# Patient Record
Sex: Female | Born: 1980 | Race: White | Hispanic: No | Marital: Married | Smoking: Never smoker
Health system: Southern US, Community
[De-identification: ages and names within clinical notes are randomized; demographics above are authoritative.]

## PROBLEM LIST (undated history)

## (undated) DIAGNOSIS — R112 Nausea with vomiting, unspecified: Secondary | ICD-10-CM

## (undated) DIAGNOSIS — D649 Anemia, unspecified: Secondary | ICD-10-CM

## (undated) DIAGNOSIS — D259 Leiomyoma of uterus, unspecified: Secondary | ICD-10-CM

## (undated) DIAGNOSIS — Z9889 Other specified postprocedural states: Secondary | ICD-10-CM

## (undated) DIAGNOSIS — N920 Excessive and frequent menstruation with regular cycle: Secondary | ICD-10-CM

---

## 2000-08-18 ENCOUNTER — Inpatient Hospital Stay (HOSPITAL_COMMUNITY): Admission: AD | Admit: 2000-08-18 | Discharge: 2000-08-20 | Payer: Self-pay | Admitting: Obstetrics and Gynecology

## 2000-08-18 ENCOUNTER — Other Ambulatory Visit: Admission: RE | Admit: 2000-08-18 | Discharge: 2000-08-18 | Payer: Self-pay | Admitting: Obstetrics and Gynecology

## 2000-08-23 ENCOUNTER — Inpatient Hospital Stay (HOSPITAL_COMMUNITY): Admission: AD | Admit: 2000-08-23 | Discharge: 2000-08-27 | Payer: Self-pay | Admitting: Obstetrics & Gynecology

## 2001-04-13 ENCOUNTER — Inpatient Hospital Stay (HOSPITAL_COMMUNITY): Admission: AD | Admit: 2001-04-13 | Discharge: 2001-04-16 | Payer: Self-pay | Admitting: Obstetrics & Gynecology

## 2011-01-14 HISTORY — PX: ENDOMETRIAL ABLATION: SHX621

## 2012-01-14 HISTORY — PX: BREAST ENHANCEMENT SURGERY: SHX7

## 2012-04-01 ENCOUNTER — Ambulatory Visit: Payer: Self-pay

## 2013-10-07 ENCOUNTER — Ambulatory Visit: Payer: Self-pay | Admitting: Physician Assistant

## 2014-01-13 DIAGNOSIS — A4902 Methicillin resistant Staphylococcus aureus infection, unspecified site: Secondary | ICD-10-CM

## 2014-01-13 HISTORY — DX: Methicillin resistant Staphylococcus aureus infection, unspecified site: A49.02

## 2014-05-10 ENCOUNTER — Ambulatory Visit
Admit: 2014-05-10 | Disposition: A | Discharge status: Home / Self Care | Payer: Self-pay | Attending: General Practice | Admitting: General Practice

## 2014-09-04 ENCOUNTER — Telehealth: Payer: Self-pay | Admitting: Gastroenterology

## 2014-09-04 NOTE — Telephone Encounter (Signed)
Please advise. Should we change or add an additional PPI in the PM to help.

## 2014-09-04 NOTE — Telephone Encounter (Signed)
Still taking dexilent. Reflux getting worse. Taking tums along with dexilent. Please advise.

## 2014-09-05 NOTE — Telephone Encounter (Signed)
Yes we can try changing her PPI. If she continues to have reflux symptoms we should consider having a pH study done on her esophagus.

## 2014-09-06 NOTE — Telephone Encounter (Signed)
LVM for pt to return my call.

## 2014-09-07 NOTE — Telephone Encounter (Signed)
Spoke with pt yesterday regarding her symptoms. Pt stated that she would like to continue with Dexilant and add Ranitidine  in the evening. Will try this for a month. If no improvement, we will switch to a new PPI.

## 2015-01-23 ENCOUNTER — Telehealth: Payer: Self-pay | Admitting: Gastroenterology

## 2015-01-23 ENCOUNTER — Ambulatory Visit: Payer: Self-pay | Admitting: Physician Assistant

## 2015-01-23 ENCOUNTER — Encounter: Payer: Self-pay | Admitting: Physician Assistant

## 2015-01-23 VITALS — BP 120/80 | HR 79 | Temp 98.2°F

## 2015-01-23 DIAGNOSIS — B029 Zoster without complications: Secondary | ICD-10-CM

## 2015-01-23 MED ORDER — FAMCICLOVIR 500 MG PO TABS: 500.0000 mg | ORAL_TABLET | Freq: Three times a day (TID) | ORAL | Status: DC

## 2015-01-23 NOTE — Progress Notes (Signed)
S: c/o stinging and burning in left rib area, hx of shingles when she was 16, no fever/chills, had one ?pustule, under a great deal of stress  O: vitals wnl, nad, skin intact no vesicles noted, n/v intact  A: herpetic neuralgia, ?early onset shingles  P: famvir 500mg  tid

## 2015-01-23 NOTE — Telephone Encounter (Signed)
Please call patient about Dexilant samples. Due to insurance changes at the beginning of the year her Dexilant prescription is now going to cost her $100.00 - and she said that is with coupons. So she would like to know if there are any samples. Please call and advise. Thanks.

## 2015-01-23 NOTE — Telephone Encounter (Signed)
Advised pt I cannot continue giving her samples every month but I can provide her with some while she checks her insurance company to see if this is a new tier issue. If this is not a tier issue then we will need to consider a different PPI.

## 2015-06-08 ENCOUNTER — Ambulatory Visit: Payer: Self-pay | Admitting: Physician Assistant

## 2015-06-08 ENCOUNTER — Encounter: Payer: Self-pay | Admitting: Physician Assistant

## 2015-06-08 VITALS — BP 110/70 | HR 78 | Temp 98.1°F

## 2015-06-08 DIAGNOSIS — J029 Acute pharyngitis, unspecified: Secondary | ICD-10-CM

## 2015-06-08 MED ORDER — MAGIC MOUTHWASH W/LIDOCAINE: 5.0000 mL | Freq: Four times a day (QID) | ORAL | Status: DC

## 2015-06-08 MED ORDER — AMOXICILLIN 875 MG PO TABS: 875.0000 mg | ORAL_TABLET | Freq: Two times a day (BID) | ORAL | Status: DC

## 2015-06-08 NOTE — Addendum Note (Signed)
Addended by: Joni ReiningSMITH, Anjelika Ausburn K on: 06/08/2015 02:26 PM   Modules accepted: Orders

## 2015-06-08 NOTE — Progress Notes (Signed)
Patient ID: Whitney Buchanan, female   DOB: [birth year], 35 y.o.   MRN: [id number] Script for Magic Mouthwash was called in to [facility] on [address] per Ron's authorization.

## 2015-06-08 NOTE — Progress Notes (Signed)
   Subjective:Sore Throat    Patient ID: Whitney Buchanan, female    DOB: July 10, 1980, 35 y.o.   MRN: [id number]  HPI Patient c/o 3 days of sore throat. States pain with swallowing. Patient can tolerate food/fluids. Denies other URI s/s. Denies N/V/D. No palliative measures for compliant.    Review of Systems Negative except for compliant.    Objective:   Physical Exam No acute distress. HEENT remarkable for erythematous posterior pharynx. Bilateral cervical adenopathy.  Neck is supple. Lungs CTA, and Heart RRR.       Assessment & Plan:Acute pharyngitis.  Amoxil, viscous Lidocaine, and Bromfed DM.Follow up 3 days if no improvement.

## 2015-11-03 IMAGING — US ABDOMEN ULTRASOUND LIMITED
1 series · 14 of 25 positions shown · non-contrast
Comparison: None.

CLINICAL DATA: Right upper quadrant pain, nausea intermittent for 1
month, constant for the past week.

EXAM:
US ABDOMEN LIMITED - RIGHT UPPER QUADRANT

[Series 1: abdomen ultrasound limited · 0.21mm/px · 14 of 103 slices shown]
[im 1/103]
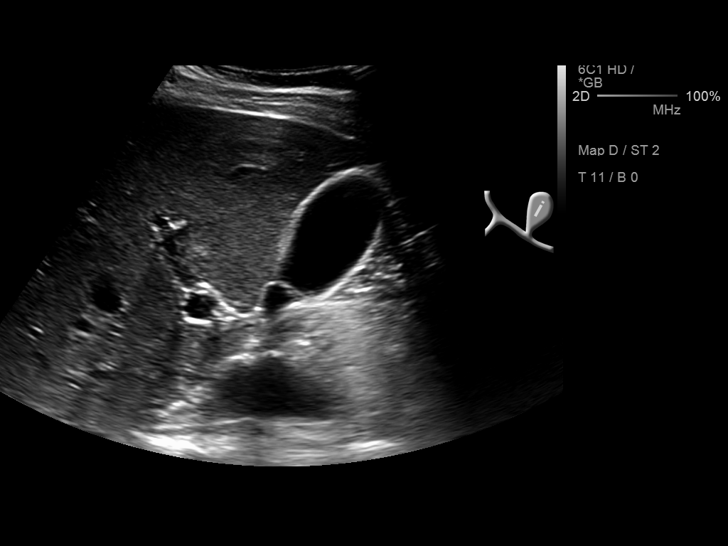
[im 9/103]
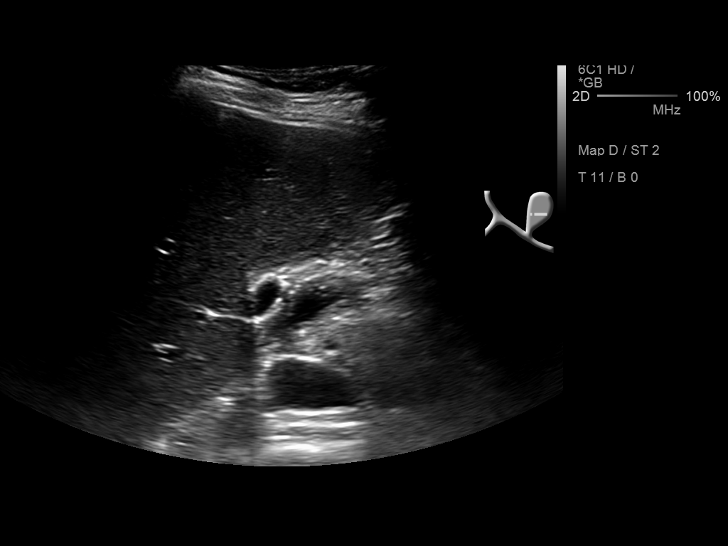
[im 18/103]
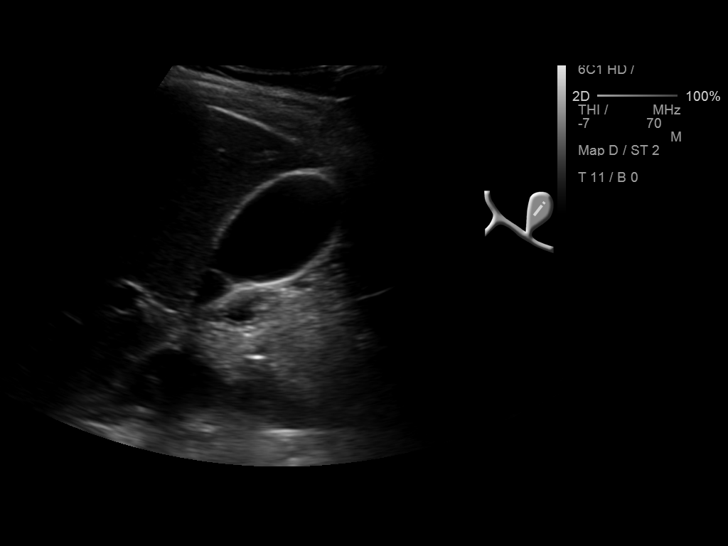
[im 26/103]
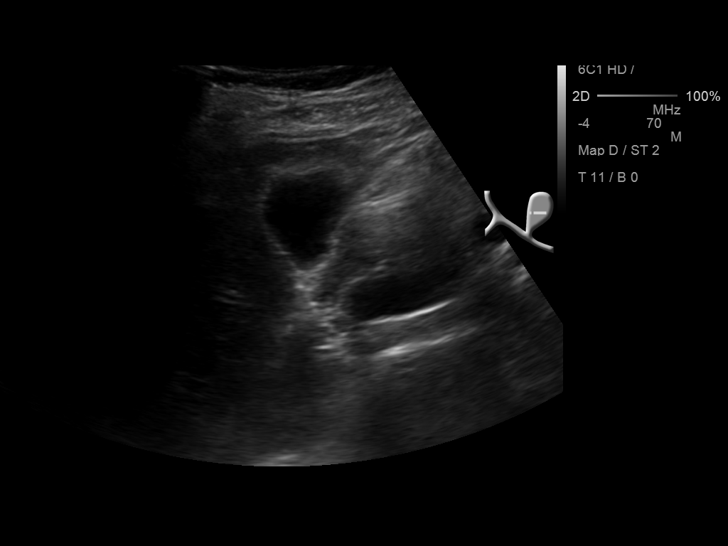
[im 35/103]
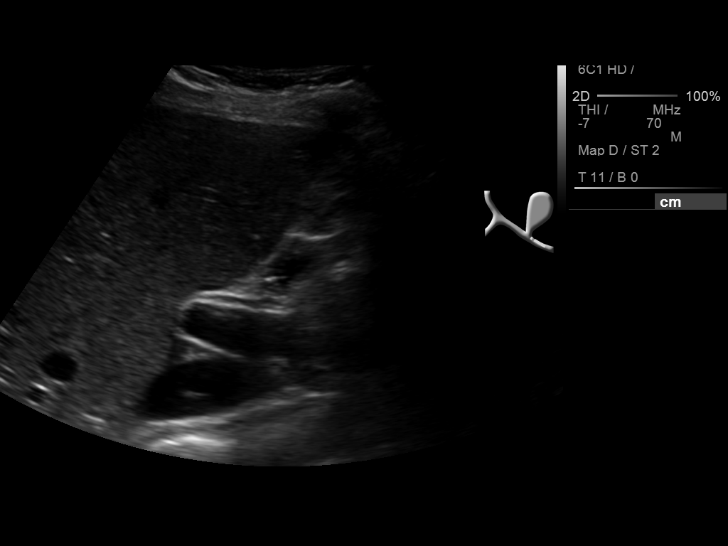
[im 39/103]
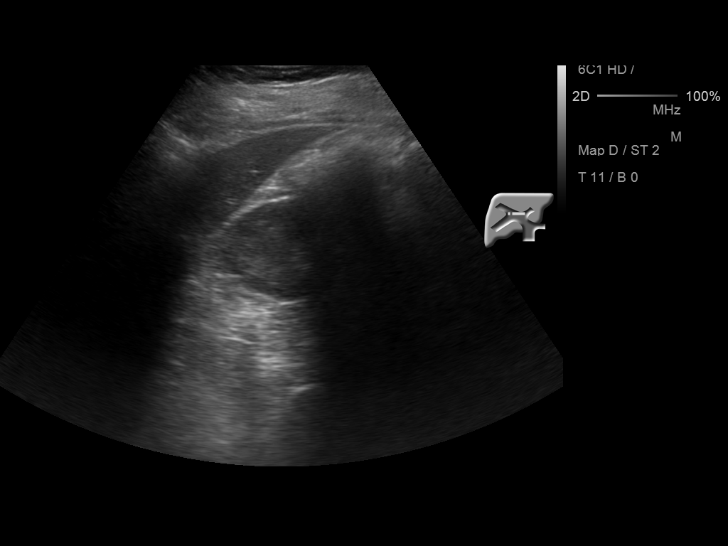
[im 47/103]
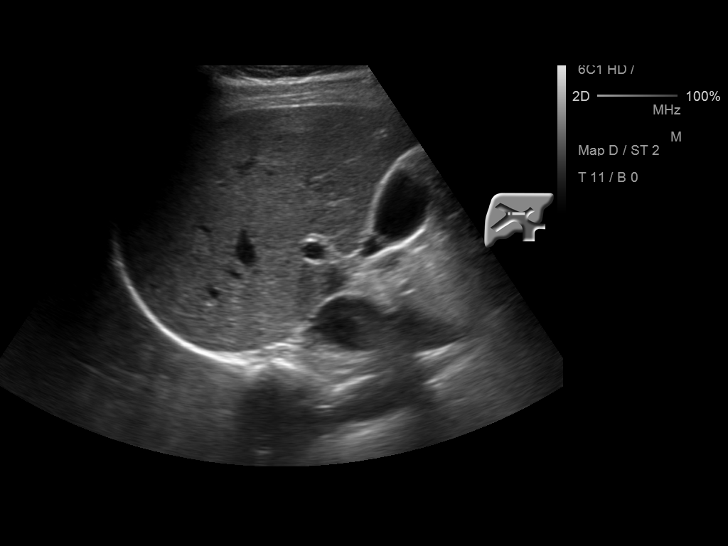
[im 56/103]
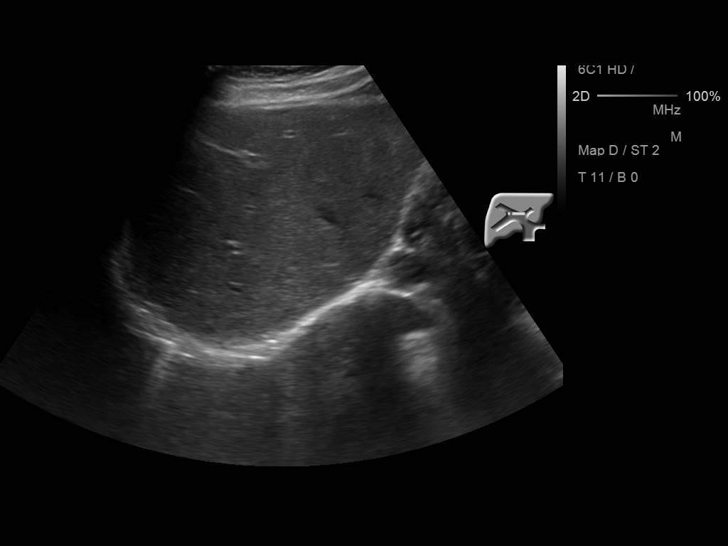
[im 64/103]
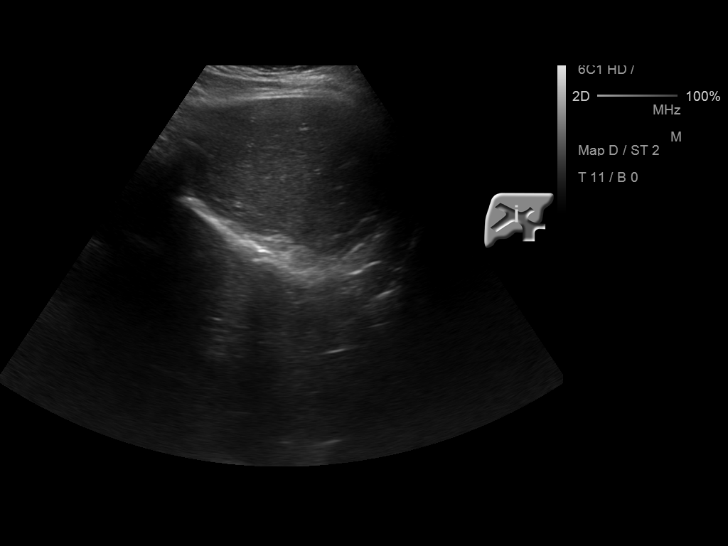
[im 69/103]
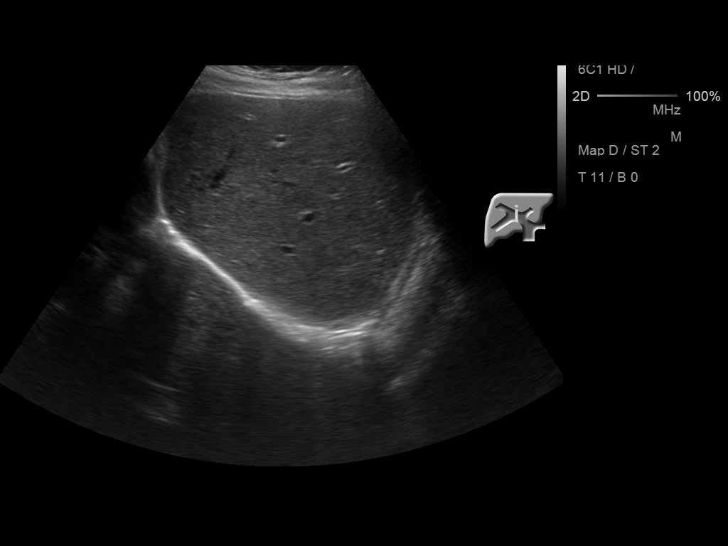
[im 77/103]
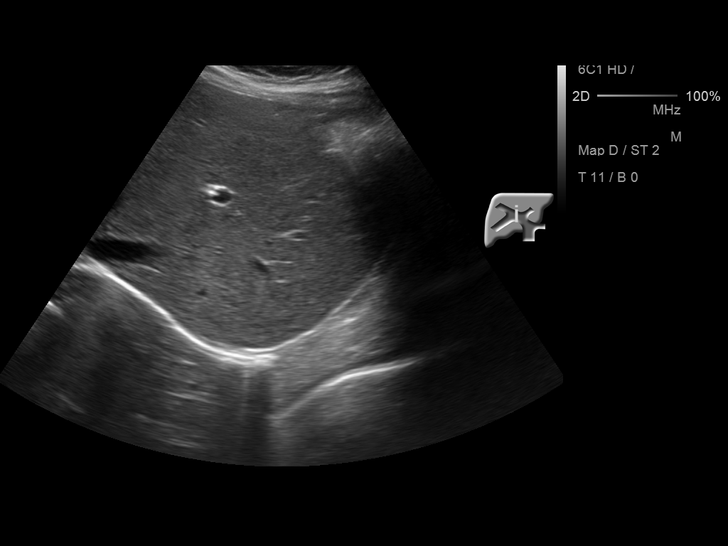
[im 86/103]
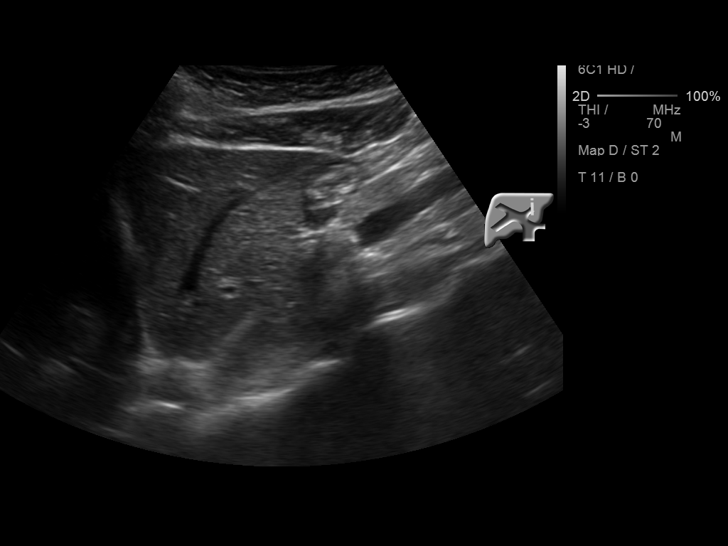
[im 94/103]
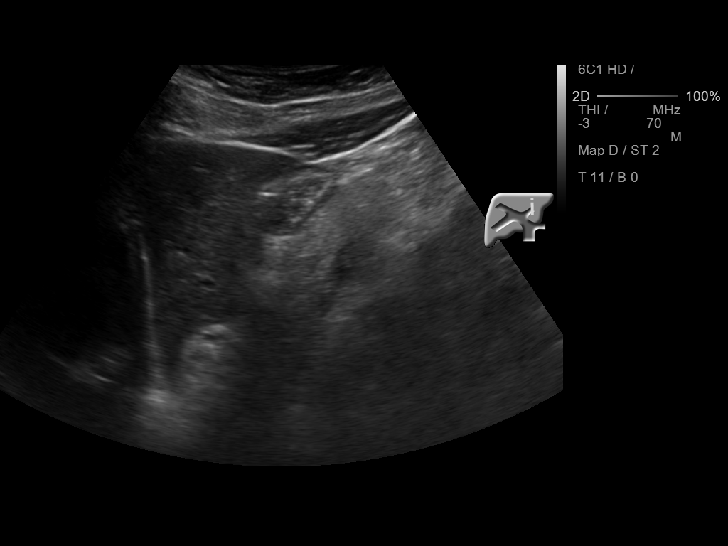
[im 103/103]
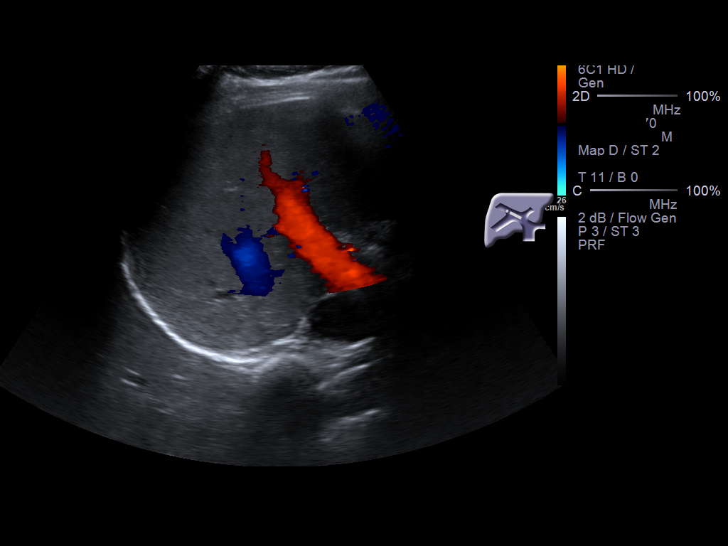

[14 of 25 positions shown; findings below may reference images not displayed]

FINDINGS: Gallbladder:

No gallstones or wall thickening visualized. No sonographic Murphy
sign noted.

Common bile duct:

Diameter: Normal caliber, 2 mm

Liver:

No focal lesion identified. Within normal limits in parenchymal
echogenicity.
IMPRESSION: Unremarkable right upper quadrant ultrasound.

## 2015-12-03 ENCOUNTER — Ambulatory Visit: Payer: Self-pay | Admitting: Physician Assistant

## 2015-12-03 VITALS — BP 100/70 | HR 72 | Temp 98.3°F

## 2015-12-03 DIAGNOSIS — N898 Other specified noninflammatory disorders of vagina: Secondary | ICD-10-CM

## 2015-12-03 DIAGNOSIS — R300 Dysuria: Secondary | ICD-10-CM

## 2015-12-03 DIAGNOSIS — N390 Urinary tract infection, site not specified: Secondary | ICD-10-CM

## 2015-12-03 DIAGNOSIS — R319 Hematuria, unspecified: Secondary | ICD-10-CM

## 2015-12-03 LAB — POCT URINALYSIS DIPSTICK
Bilirubin, UA: NEGATIVE
GLUCOSE UA: NEGATIVE
KETONES UA: NEGATIVE
Nitrite, UA: NEGATIVE
Protein, UA: NEGATIVE
SPEC GRAV UA: 1.02
Urobilinogen, UA: 0.2
pH, UA: 6

## 2015-12-03 MED ORDER — CIPROFLOXACIN HCL 500 MG PO TABS: 500.0000 mg | ORAL_TABLET | Freq: Two times a day (BID) | ORAL | 0 refills | Status: DC

## 2015-12-03 NOTE — Progress Notes (Signed)
S:  C/o uti sx for 2 days, burning, itching, some vaginal discharge yellowish in color, no  urgency, frequency,  abdominal pain or flank pain: no fever/chills, has an appointment with her gyn next Monday;  Remainder ros neg  O:  Vitals wnl, nad, no cva tenderness, back nontender, lungs c t a,cv rrr, abd soft nontender, bs normal, n/v intact, ua 1+ leuks  A: uti/vaginal discharge  P: cipro 500mg  bid x 3d if urinary sx increase;  increase water intake, add cranberry juice, return if not improving in 2 -3 days, return earlier if worsening, discussed pyelonephritis sx, also discussed probiotics as it sounds like she has BV ; told he we cannot dx that without doing a pelvic and we do not have the facilities do pelvics

## 2016-02-14 ENCOUNTER — Ambulatory Visit: Payer: Self-pay | Admitting: Physician Assistant

## 2016-02-14 ENCOUNTER — Encounter: Payer: Self-pay | Admitting: Physician Assistant

## 2016-02-14 VITALS — BP 105/79 | HR 85 | Temp 98.4°F

## 2016-02-14 DIAGNOSIS — R5383 Other fatigue: Secondary | ICD-10-CM

## 2016-02-14 DIAGNOSIS — J029 Acute pharyngitis, unspecified: Secondary | ICD-10-CM

## 2016-02-14 LAB — POCT INFLUENZA A/B
INFLUENZA A, POC: NEGATIVE
Influenza B, POC: NEGATIVE

## 2016-02-14 LAB — POCT RAPID STREP A (OFFICE): Rapid Strep A Screen: NEGATIVE

## 2016-02-14 NOTE — Progress Notes (Signed)
S: C/o runny nose and congestion with sore throat for 3 days,  Denies fever, chills, denies cp/sob, v/d; mucus was green this am but clear throughout the day, cough is sporadic, worried as she has to work this weekend and wants to make sure she doesn't have flu or strep  Using otc meds: robitussin  O: PE: vitals wnl, nad,  perrl eomi, normocephalic, tms dull, nasal mucosa red and swollen, throat injected, neck supple no lymph, lungs c t a, cv rrr, neuro intact, flu swab neg, q strep neg  A:  Acute viral illness   P: drink fluids, continue regular meds , use otc meds of choice, return if not improving in 5 days, return earlier if worsening , if sx worsening and mucus is green all day gave pt rx for zpack if needed

## 2016-04-28 ENCOUNTER — Encounter: Payer: Self-pay | Admitting: Physician Assistant

## 2016-04-28 ENCOUNTER — Ambulatory Visit: Payer: Self-pay | Admitting: Physician Assistant

## 2016-04-28 VITALS — BP 119/69 | HR 61 | Temp 97.6°F

## 2016-04-28 DIAGNOSIS — H8110 Benign paroxysmal vertigo, unspecified ear: Secondary | ICD-10-CM

## 2016-04-28 DIAGNOSIS — G43909 Migraine, unspecified, not intractable, without status migrainosus: Secondary | ICD-10-CM

## 2016-04-28 MED ORDER — ONDANSETRON 4 MG PO TBDP: 4.0000 mg | ORAL_TABLET | Freq: Three times a day (TID) | ORAL | 0 refills | Status: DC | PRN

## 2016-04-28 MED ORDER — MECLIZINE HCL 25 MG PO TABS: 25.0000 mg | ORAL_TABLET | Freq: Three times a day (TID) | ORAL | 0 refills | Status: DC | PRN

## 2016-04-28 NOTE — Progress Notes (Signed)
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

## 2016-10-28 ENCOUNTER — Encounter: Payer: Self-pay | Admitting: Physician Assistant

## 2016-10-28 ENCOUNTER — Ambulatory Visit: Payer: Self-pay | Admitting: Physician Assistant

## 2016-10-28 VITALS — BP 139/68 | HR 71 | Temp 98.5°F | Resp 16

## 2016-10-28 DIAGNOSIS — R8290 Unspecified abnormal findings in urine: Secondary | ICD-10-CM

## 2016-10-28 LAB — POCT URINALYSIS DIPSTICK
Bilirubin, UA: NEGATIVE
Glucose, UA: NEGATIVE
Ketones, UA: NEGATIVE
LEUKOCYTES UA: NEGATIVE
NITRITE UA: NEGATIVE
PROTEIN UA: NEGATIVE
RBC UA: NEGATIVE
Spec Grav, UA: 1.015 (ref 1.010–1.025)
UROBILINOGEN UA: 0.2 U/dL
pH, UA: 7 (ref 5.0–8.0)

## 2016-10-28 NOTE — Progress Notes (Signed)
S:  C/o uti sx for 7 days, odor, frequency, denies fever/chills, vaginal discharge, abdominal pain or flank pain:  Remainder ros neg  O:  Vitals wnl, nad, no cva tenderness, lungs c t a,cv rrr, ua neg, dip stick changed slightly after sitting longer than normal test  A: malodorous urine  P: urine culture increase water intake, add cranberry juice, return if not improving in 2 -3 days, return earlier if worsening, discussed pyelonephritis sx

## 2016-10-30 ENCOUNTER — Other Ambulatory Visit: Payer: Self-pay | Admitting: Physician Assistant

## 2016-10-30 LAB — URINE CULTURE

## 2016-10-30 MED ORDER — AMOXICILLIN 875 MG PO TABS: 875.0000 mg | ORAL_TABLET | Freq: Two times a day (BID) | ORAL | 0 refills | Status: DC

## 2016-10-30 MED ORDER — FLUCONAZOLE 150 MG PO TABS: 150.0000 mg | ORAL_TABLET | Freq: Once | ORAL | 0 refills | Status: AC

## 2016-10-30 NOTE — Progress Notes (Unsigned)
amoxi

## 2021-01-13 HISTORY — PX: LASIK: SHX215

## 2022-05-02 DIAGNOSIS — R102 Pelvic and perineal pain: Secondary | ICD-10-CM | POA: Diagnosis present

## 2022-05-07 ENCOUNTER — Other Ambulatory Visit: Payer: Self-pay | Admitting: Obstetrics and Gynecology

## 2022-06-03 NOTE — H&P (Signed)
Preoperative History and Physical  Chief Complaint: Whitney Buchanan is a 42 y.o. G2P0 here for surgical management of Menorrhagia with regular cycle and pelvic pain.   No significant preoperative concerns.  History of Present Illness: 42 y.o. G17P0 female who presents with bad periods for her whole life. She has learned to deal with them.  The periods are getting worse in terms of pain.  She has trouble with voiding and having bowel movements due to pain. She has trouble walking, riding in a car (hitting a bump). She requires a week to recover.  The bleeding has always been bad for her.  The clotting is not worse. The pain and the swelling is worse.  In the past she has tried pain medication.  She had an endometrial ablation 10-15 years ago.  The ablation stopped her menses (or they were less) for about 2 months.  Then she started having periods again.  She has occasionally had 2 cycles per month.   During intercourse she feels a pressure.  The pain is not overwhelming.  She is able to have intercourse.  She is not interested in hormonal treatment. With this her cycle was all over the place.      Pelvic ultrasound 05/02/2022: Uterus: Anteverted, irregular contour and heterogeneous, ? Adenomyosis   Endometrium= 6.56mm   Right ovary appears wnl Left ovary appears wnl   No free fluid seen  Proposed surgery: Robot assisted total laparoscopic hysterectomy, bilateral salpingectomy, cystoscopy, excision of abnormal tissue   Past Medical History:  Diagnosis Date   Anemia since childhood   Fibroid apprx 12 years ago   vaginal ultrasound done at [facility]   History of abnormal cervical Pap smear 12 years ago apprx   fibroids and heavy double monthly cycles, ablations done to help   Past Surgical History:  Procedure Laterality Date   BREAST SURGERY  10 years   implants   ENDOMETRIAL ABLATION  10 years ago appx   2 heavy painful monthly cycles- this was the main option given.   OB History   Gravida Para Term Preterm AB Living  2         2  SAB IAB Ectopic Molar Multiple Live Births                 # Outcome Date GA Lbr Len/2nd Weight Sex Type Anes PTL Lv  2 Gravida           1 Gravida           Patient denies any other pertinent gynecologic issues.   Current Outpatient Medications on File Prior to Visit  Medication Sig Dispense Refill   ibuprofen (ADVIL LIQUI-GELS MINIS) 200 mg Cap Take by mouth     phentermine (ADIPEX-P) 37.5 mg tablet Take 37.5 mg by mouth every morning before breakfast     No current facility-administered medications on file prior to visit.   No Known Allergies  Social History:   reports that she has never smoked. She has never used smokeless tobacco. She reports that she does not drink alcohol and does not use drugs.  Family History  Problem Relation Name Age of Onset   Breast cancer Mother Donetta Potts        in her 42's   Anesthesia problems Father Devoria Albe        very sensitive...needs smaller doses   Diabetes Father Devoria Albe        on Insulin   High blood pressure (Hypertension) Father  Devoria Albe        takes daily pill   Heart disease Maternal Grandmother Lezlie Octave        pacemaker   Stroke Maternal Grandmother Lezlie Octave    Pancreatic cancer Maternal Aunt Shela Nevin        She only lived a year after diagnosis    Review of Systems: Noncontributory  PHYSICAL EXAM: Blood pressure 99/67, pulse 101, height 165.1 cm (5\' 5" ), weight 70.8 kg (156 lb), last menstrual period 05/15/2022. Physical Exam Constitutional:      General: She is not in acute distress.    Appearance: Normal appearance.  Genitourinary:     Bladder and urethral meatus normal.     No lesions in the vagina.     Right Labia: No rash, tenderness, lesions, skin changes or Bartholin's cyst.    Left Labia: No tenderness, lesions, skin changes, Bartholin's cyst or rash.    No labial fusion noted.     Pelvic Tanner Score: 5/5.    No vaginal discharge,  tenderness or bleeding.     No vaginal prolapse present.    No vaginal atrophy present.    No cervical motion tenderness, discharge, friability, lesion, polyp or nabothian cyst.     Pelvic exam was performed with patient in the lithotomy position.  HENT:     Head: Normocephalic and atraumatic.  Eyes:     General: No scleral icterus.    Conjunctiva/sclera: Conjunctivae normal.  Cardiovascular:     Rate and Rhythm: Normal rate and regular rhythm.     Heart sounds: No murmur heard.    No friction rub. No gallop.  Pulmonary:     Effort: Pulmonary effort is normal. No respiratory distress.     Breath sounds: Normal breath sounds. No wheezing, rhonchi or rales.  Abdominal:     General: Bowel sounds are normal. There is no distension.     Palpations: Abdomen is soft. There is no mass.     Tenderness: There is no abdominal tenderness. There is no guarding or rebound.  Musculoskeletal:        General: No swelling. Normal range of motion.  Neurological:     General: No focal deficit present.     Mental Status: She is oriented to person, place, and time.     Cranial Nerves: No cranial nerve deficit.  Skin:    General: Skin is warm and dry.     Findings: No lesion.  Psychiatric:        Mood and Affect: Mood normal.        Behavior: Behavior normal.        Judgment: Judgment normal.  Vitals and nursing note reviewed.      Labs: No results found for this or any previous visit (from the past 336 hour(s)).  Imaging Studies: No results found.  Assessment: 1. Menorrhagia with regular cycle   2. Pelvic pain in female      Plan: Patient will undergo surgical management with the above-noted surgery.   The risks of surgery were discussed in detail with the patient including but not limited to: bleeding which may require transfusion or reoperation; infection which may require antibiotics; injury to surrounding organs which may involve bowel, bladder, ureters ; need for additional  procedures including laparoscopy or laparotomy; thromboembolic phenomenon, surgical site problems and other postoperative/anesthesia complications. Likelihood of success in alleviating the patient's condition was discussed. Routine postoperative instructions will be reviewed with the patient and her family in detail  after surgery.  The patient concurred with the proposed plan, giving informed written consent for the surgery.   Preoperative prophylactic antibiotics, as indicated, and SCDs ordered on call to the OR.    Pap smear collected today, as patient is due.   Return in 5 weeks (on 07/10/2022) for Post-op incision check.    Attestation Statement:   I personally performed the service. (TP)  Shamiah Kahler Teola Bradley, MD  [facility] OB/GYN [facility] 06/03/2022 2:40 PM

## 2022-06-20 ENCOUNTER — Other Ambulatory Visit: Payer: Self-pay

## 2022-06-26 ENCOUNTER — Encounter
Admission: RE | Admit: 2022-06-26 | Discharge: 2022-06-26 | Disposition: A | Discharge status: Home / Self Care | Payer: BC Managed Care – PPO | Source: Ambulatory Visit | Attending: Obstetrics and Gynecology | Admitting: Obstetrics and Gynecology

## 2022-06-26 VITALS — Ht 65.0 in | Wt 153.0 lb

## 2022-06-26 DIAGNOSIS — N920 Excessive and frequent menstruation with regular cycle: Secondary | ICD-10-CM

## 2022-06-26 DIAGNOSIS — Z01812 Encounter for preprocedural laboratory examination: Secondary | ICD-10-CM

## 2022-06-26 HISTORY — DX: Anemia, unspecified: D64.9

## 2022-06-26 HISTORY — DX: Leiomyoma of uterus, unspecified: D25.9

## 2022-06-26 HISTORY — DX: Nausea with vomiting, unspecified: R11.2

## 2022-06-26 HISTORY — DX: Excessive and frequent menstruation with regular cycle: N92.0

## 2022-06-26 HISTORY — DX: Other specified postprocedural states: Z98.890

## 2022-06-26 NOTE — Patient Instructions (Addendum)
Your procedure is scheduled on: Thursday, June 20 Report to the Registration Desk on the 1st floor of the [facility] To find out your arrival time, please call [phone] between 1PM - 3PM on: Wednesday, June 19 If your arrival time is 6:00 am, do not arrive before that time as the [facility] entrance doors do not open until 6:00 am.  REMEMBER: Instructions that are not followed completely may result in serious medical risk, up to and including death; or upon the discretion of your surgeon and anesthesiologist your surgery may need to be rescheduled.  Do not eat or drink after midnight the night before surgery.  No gum chewing or hard candies.  One week prior to surgery: starting June 13 Stop Anti-inflammatories (NSAIDS) such as Advil, Aleve, Ibuprofen, Motrin, Naproxen, Naprosyn and Aspirin based products such as Excedrin, Goody's Powder, BC Powder. Stop ANY OVER THE COUNTER supplements until after surgery. You may however, continue to take Tylenol if needed for pain up until the day of surgery.  DO NOT TAKE ANY MEDICATIONS THE MORNING OF SURGERY   No Alcohol for 24 hours before or after surgery.  No Smoking including e-cigarettes for 24 hours before surgery.  No chewable tobacco products for at least 6 hours before surgery.  No nicotine patches on the day of surgery.  Do not use any "recreational" drugs for at least a week (preferably 2 weeks) before your surgery.  Please be advised that the combination of cocaine and anesthesia may have negative outcomes, up to and including death. If you test positive for cocaine, your surgery will be cancelled.  On the morning of surgery brush your teeth with toothpaste and water, you may rinse your mouth with mouthwash if you wish. Do not swallow any toothpaste or mouthwash.  Use CHG Soap as directed on instruction sheet.  Do not wear jewelry, make-up, hairpins, clips or nail polish.  Do not wear lotions, powders, or perfumes.   Do  not shave body hair from the neck down 48 hours before surgery.  Contact lenses, hearing aids and dentures may not be worn into surgery.  Do not bring valuables to the hospital. [facility] is not responsible for any missing/lost belongings or valuables.   Notify your doctor if there is any change in your medical condition (cold, fever, infection).  Wear comfortable clothing (specific to your surgery type) to the hospital.  After surgery, you can help prevent lung complications by doing breathing exercises.  Take deep breaths and cough every 1-2 hours. Your doctor may order a device called an Incentive Spirometer to help you take deep breaths. When coughing or sneezing, hold a pillow firmly against your incision with both hands. This is called "splinting." Doing this helps protect your incision. It also decreases belly discomfort.  If you are being discharged the day of surgery, you will not be allowed to drive home. You will need a responsible individual to drive you home and stay with you for 24 hours after surgery.   If you are taking public transportation, you will need to have a responsible individual with you.  Please call the Pre-admissions Testing Dept. at [phone] if you have any questions about these instructions.  Surgery Visitation Policy:  Patients having surgery or a procedure may have two visitors.  Children under the age of 71 must have an adult with them who is not the patient.     Preparing for Surgery with CHLORHEXIDINE GLUCONATE (CHG) Soap  Chlorhexidine Gluconate (CHG)  Soap  o An antiseptic cleaner that kills germs and bonds with the skin to continue killing germs even after washing  o Used for showering the night before surgery and morning of surgery  Before surgery, you can play an important role by reducing the number of germs on your skin.  CHG (Chlorhexidine gluconate) soap is an antiseptic cleanser which kills germs and bonds with the skin to  continue killing germs even after washing.  Please do not use if you have an allergy to CHG or antibacterial soaps. If your skin becomes reddened/irritated stop using the CHG.  1. Shower the NIGHT BEFORE SURGERY and the MORNING OF SURGERY with CHG soap.  2. If you choose to wash your hair, wash your hair first as usual with your normal shampoo.  3. After shampooing, rinse your hair and body thoroughly to remove the shampoo.  4. Use CHG as you would any other liquid soap. You can apply CHG directly to the skin and wash gently with a scrungie or a clean washcloth.  5. Apply the CHG soap to your body only from the neck down. Do not use on open wounds or open sores. Avoid contact with your eyes, ears, mouth, and genitals (private parts). Wash face and genitals (private parts) with your normal soap.  6. Wash thoroughly, paying special attention to the area where your surgery will be performed.  7. Thoroughly rinse your body with warm water.  8. Do not shower/wash with your normal soap after using and rinsing off the CHG soap.  9. Pat yourself dry with a clean towel.  10. Wear clean pajamas to bed the night before surgery.  12. Place clean sheets on your bed the night of your first shower and do not sleep with pets.  13. Shower again with the CHG soap on the day of surgery prior to arriving at the hospital.  14. Do not apply any deodorants/lotions/powders.  15. Please wear clean clothes to the hospital.

## 2022-06-27 ENCOUNTER — Encounter
Admission: RE | Admit: 2022-06-27 | Discharge: 2022-06-27 | Disposition: A | Discharge status: Home / Self Care | Payer: BC Managed Care – PPO | Source: Ambulatory Visit | Attending: Obstetrics and Gynecology | Admitting: Obstetrics and Gynecology

## 2022-06-27 DIAGNOSIS — N920 Excessive and frequent menstruation with regular cycle: Secondary | ICD-10-CM | POA: Insufficient documentation

## 2022-06-27 DIAGNOSIS — Z01812 Encounter for preprocedural laboratory examination: Secondary | ICD-10-CM | POA: Diagnosis not present

## 2022-06-27 LAB — BASIC METABOLIC PANEL
Anion gap: 8 (ref 5–15)
BUN: 12 mg/dL (ref 6–20)
CO2: 22 mmol/L (ref 22–32)
Calcium: 9.1 mg/dL (ref 8.9–10.3)
Chloride: 105 mmol/L (ref 98–111)
Creatinine, Ser: 0.84 mg/dL (ref 0.44–1.00)
GFR, Estimated: >60 mL/min (ref >60)
Glucose, Bld: 88 mg/dL (ref 70–99)
Potassium: 4 mmol/L (ref 3.5–5.1)
Sodium: 135 mmol/L (ref 135–145)

## 2022-06-27 LAB — CBC
HCT: 42.8 % (ref 36.0–46.0)
Hemoglobin: 13.9 g/dL (ref 12.0–15.0)
MCH: 29 pg (ref 26.0–34.0)
MCHC: 32.5 g/dL (ref 30.0–36.0)
MCV: 89.4 fL (ref 80.0–100.0)
Platelets: 274 10*3/uL (ref 150–400)
RBC: 4.79 MIL/uL (ref 3.87–5.11)
RDW: 13.4 % (ref 11.5–15.5)
WBC: 9.5 10*3/uL (ref 4.0–10.5)
nRBC: 0 % (ref 0.0–0.2)

## 2022-06-27 LAB — TYPE AND SCREEN

## 2022-07-02 MED ORDER — POVIDONE-IODINE 10 % EX SWAB
2.0000 | Freq: Once | CUTANEOUS | Status: AC
  Administered 2022-07-03: 2 via TOPICAL

## 2022-07-02 MED ORDER — CEFAZOLIN SODIUM-DEXTROSE 2-4 GM/100ML-% IV SOLN
2.0000 g | INTRAVENOUS | Status: AC
  Administered 2022-07-03: 2 g via INTRAVENOUS

## 2022-07-02 MED ORDER — CHLORHEXIDINE GLUCONATE 0.12 % MT SOLN
15.0000 mL | Freq: Once | OROMUCOSAL | Status: AC
  Administered 2022-07-03: 15 mL via OROMUCOSAL

## 2022-07-02 MED ORDER — SOD CITRATE-CITRIC ACID 500-334 MG/5ML PO SOLN
30.0000 mL | ORAL | Status: AC
  Administered 2022-07-03: 30 mL via ORAL
  Filled 2022-07-02 (×2): qty 30

## 2022-07-02 MED ORDER — SODIUM CHLORIDE 0.9 % IV SOLN: INTRAVENOUS | Status: DC

## 2022-07-02 MED ORDER — FAMOTIDINE 20 MG PO TABS
20.0000 mg | ORAL_TABLET | Freq: Once | ORAL | Status: AC
  Administered 2022-07-03: 20 mg via ORAL

## 2022-07-02 MED ORDER — ORAL CARE MOUTH RINSE: 15.0000 mL | Freq: Once | OROMUCOSAL | Status: AC

## 2022-07-02 MED ORDER — LACTATED RINGERS IV SOLN: INTRAVENOUS | Status: DC

## 2022-07-03 ENCOUNTER — Encounter
Admission: RE | Disposition: A | Discharge status: Home / Self Care | Payer: Self-pay | Source: Home / Self Care | Attending: Obstetrics and Gynecology

## 2022-07-03 ENCOUNTER — Other Ambulatory Visit: Payer: Self-pay

## 2022-07-03 ENCOUNTER — Ambulatory Visit
Admission: RE | Admit: 2022-07-03 | Discharge: 2022-07-03 | Disposition: A | Discharge status: Home / Self Care | Payer: BC Managed Care – PPO | Attending: Obstetrics and Gynecology | Admitting: Obstetrics and Gynecology

## 2022-07-03 ENCOUNTER — Ambulatory Visit: Payer: BC Managed Care – PPO | Admitting: Urgent Care

## 2022-07-03 ENCOUNTER — Encounter: Payer: Self-pay | Admitting: Obstetrics and Gynecology

## 2022-07-03 ENCOUNTER — Ambulatory Visit: Payer: BC Managed Care – PPO | Admitting: Certified Registered"

## 2022-07-03 DIAGNOSIS — R102 Pelvic and perineal pain: Secondary | ICD-10-CM | POA: Insufficient documentation

## 2022-07-03 DIAGNOSIS — N920 Excessive and frequent menstruation with regular cycle: Secondary | ICD-10-CM | POA: Diagnosis present

## 2022-07-03 DIAGNOSIS — Z01812 Encounter for preprocedural laboratory examination: Secondary | ICD-10-CM

## 2022-07-03 DIAGNOSIS — D252 Subserosal leiomyoma of uterus: Secondary | ICD-10-CM | POA: Insufficient documentation

## 2022-07-03 DIAGNOSIS — G8929 Other chronic pain: Secondary | ICD-10-CM | POA: Diagnosis not present

## 2022-07-03 DIAGNOSIS — D251 Intramural leiomyoma of uterus: Secondary | ICD-10-CM | POA: Diagnosis not present

## 2022-07-03 HISTORY — PX: ROBOTIC ASSISTED LAPAROSCOPIC HYSTERECTOMY AND SALPINGECTOMY: SHX6379

## 2022-07-03 HISTORY — PX: CYSTOSCOPY: SHX5120

## 2022-07-03 LAB — TYPE AND SCREEN
ABO/RH(D): O POS
Antibody Screen: NEGATIVE

## 2022-07-03 LAB — POCT PREGNANCY, URINE: Preg Test, Ur: NEGATIVE

## 2022-07-03 LAB — ABO/RH: ABO/RH(D): O POS

## 2022-07-03 SURGERY — XI ROBOTIC ASSISTED LAPAROSCOPIC HYSTERECTOMY AND SALPINGECTOMY
Anesthesia: General

## 2022-07-03 MED ORDER — ONDANSETRON HCL 4 MG/2ML IJ SOLN
4.0000 mg | Freq: Once | INTRAMUSCULAR | Status: AC
  Administered 2022-07-03: 4 mg via INTRAVENOUS

## 2022-07-03 MED ORDER — OXYCODONE HCL 5 MG/5ML PO SOLN: 5.0000 mg | Freq: Once | ORAL | Status: AC | PRN

## 2022-07-03 MED ORDER — IBUPROFEN 600 MG PO TABS: 600.0000 mg | ORAL_TABLET | Freq: Four times a day (QID) | ORAL | 0 refills | Status: AC

## 2022-07-03 MED ORDER — PROPOFOL 1000 MG/100ML IV EMUL
INTRAVENOUS | Status: AC
  Filled 2022-07-03: qty 100

## 2022-07-03 MED ORDER — OXYCODONE HCL 5 MG PO TABS
5.0000 mg | ORAL_TABLET | Freq: Once | ORAL | Status: AC | PRN
  Administered 2022-07-03: 5 mg via ORAL

## 2022-07-03 MED ORDER — FAMOTIDINE 20 MG PO TABS
ORAL_TABLET | ORAL | Status: AC
  Filled 2022-07-03: qty 1

## 2022-07-03 MED ORDER — OXYCODONE HCL 5 MG PO TABS
ORAL_TABLET | ORAL | Status: AC
  Filled 2022-07-03: qty 1

## 2022-07-03 MED ORDER — DEXAMETHASONE SODIUM PHOSPHATE 10 MG/ML IJ SOLN
INTRAMUSCULAR | Status: AC
  Filled 2022-07-03: qty 1

## 2022-07-03 MED ORDER — KETOROLAC TROMETHAMINE 30 MG/ML IJ SOLN
INTRAMUSCULAR | Status: AC
  Filled 2022-07-03: qty 1

## 2022-07-03 MED ORDER — LIDOCAINE HCL (CARDIAC) PF 100 MG/5ML IV SOSY
PREFILLED_SYRINGE | INTRAVENOUS | Status: DC | PRN
  Administered 2022-07-03: 50 mg via INTRAVENOUS

## 2022-07-03 MED ORDER — PROPOFOL 500 MG/50ML IV EMUL
INTRAVENOUS | Status: DC | PRN
  Administered 2022-07-03: 160 ug/kg/min via INTRAVENOUS

## 2022-07-03 MED ORDER — FENTANYL CITRATE (PF) 100 MCG/2ML IJ SOLN: 25.0000 ug | INTRAMUSCULAR | Status: DC | PRN

## 2022-07-03 MED ORDER — KETOROLAC TROMETHAMINE 30 MG/ML IJ SOLN
INTRAMUSCULAR | Status: DC | PRN
  Administered 2022-07-03: 30 mg via INTRAVENOUS

## 2022-07-03 MED ORDER — MIDAZOLAM HCL 2 MG/2ML IJ SOLN
INTRAMUSCULAR | Status: AC
  Filled 2022-07-03: qty 2

## 2022-07-03 MED ORDER — CHLORHEXIDINE GLUCONATE 0.12 % MT SOLN
OROMUCOSAL | Status: AC
  Filled 2022-07-03: qty 15

## 2022-07-03 MED ORDER — CEFAZOLIN SODIUM-DEXTROSE 2-4 GM/100ML-% IV SOLN
INTRAVENOUS | Status: AC
  Filled 2022-07-03: qty 100

## 2022-07-03 MED ORDER — ONDANSETRON HCL 4 MG/2ML IJ SOLN
INTRAMUSCULAR | Status: DC | PRN
  Administered 2022-07-03: 4 mg via INTRAVENOUS

## 2022-07-03 MED ORDER — DEXAMETHASONE SODIUM PHOSPHATE 10 MG/ML IJ SOLN
INTRAMUSCULAR | Status: DC | PRN
  Administered 2022-07-03: 10 mg via INTRAVENOUS

## 2022-07-03 MED ORDER — FENTANYL CITRATE (PF) 100 MCG/2ML IJ SOLN
INTRAMUSCULAR | Status: DC | PRN
  Administered 2022-07-03 (×2): 50 ug via INTRAVENOUS

## 2022-07-03 MED ORDER — BUPIVACAINE HCL (PF) 0.5 % IJ SOLN
INTRAMUSCULAR | Status: AC
  Filled 2022-07-03: qty 30

## 2022-07-03 MED ORDER — ONDANSETRON HCL 4 MG/2ML IJ SOLN
INTRAMUSCULAR | Status: AC
  Filled 2022-07-03: qty 2

## 2022-07-03 MED ORDER — OXYCODONE HCL 5 MG PO TABS: 5.0000 mg | ORAL_TABLET | Freq: Once | ORAL | Status: DC

## 2022-07-03 MED ORDER — BUPIVACAINE HCL 0.5 % IJ SOLN
INTRAMUSCULAR | Status: DC | PRN
  Administered 2022-07-03: 11 mL

## 2022-07-03 MED ORDER — ROCURONIUM BROMIDE 10 MG/ML (PF) SYRINGE
PREFILLED_SYRINGE | INTRAVENOUS | Status: AC
  Filled 2022-07-03: qty 10

## 2022-07-03 MED ORDER — PROPOFOL 10 MG/ML IV BOLUS
INTRAVENOUS | Status: DC | PRN
  Administered 2022-07-03: 150 mg via INTRAVENOUS

## 2022-07-03 MED ORDER — LIDOCAINE HCL (PF) 2 % IJ SOLN
INTRAMUSCULAR | Status: AC
  Filled 2022-07-03: qty 5

## 2022-07-03 MED ORDER — ROCURONIUM BROMIDE 100 MG/10ML IV SOLN
INTRAVENOUS | Status: DC | PRN
  Administered 2022-07-03: 20 mg via INTRAVENOUS
  Administered 2022-07-03: 50 mg via INTRAVENOUS
  Administered 2022-07-03: 10 mg via INTRAVENOUS

## 2022-07-03 MED ORDER — ONDANSETRON 4 MG PO TBDP: 4.0000 mg | ORAL_TABLET | Freq: Four times a day (QID) | ORAL | 0 refills | Status: AC | PRN

## 2022-07-03 MED ORDER — OXYCODONE-ACETAMINOPHEN 5-325 MG PO TABS: 1.0000 | ORAL_TABLET | Freq: Four times a day (QID) | ORAL | 0 refills | Status: AC | PRN

## 2022-07-03 MED ORDER — FENTANYL CITRATE (PF) 100 MCG/2ML IJ SOLN
INTRAMUSCULAR | Status: AC
  Filled 2022-07-03: qty 2

## 2022-07-03 MED ORDER — DEXMEDETOMIDINE HCL IN NACL 80 MCG/20ML IV SOLN
INTRAVENOUS | Status: DC | PRN
  Administered 2022-07-03 (×2): 8 ug via INTRAVENOUS

## 2022-07-03 MED ORDER — PROPOFOL 10 MG/ML IV BOLUS
INTRAVENOUS | Status: AC
  Filled 2022-07-03: qty 20

## 2022-07-03 MED ORDER — ACETAMINOPHEN 10 MG/ML IV SOLN
INTRAVENOUS | Status: DC | PRN
  Administered 2022-07-03: 1000 mg via INTRAVENOUS

## 2022-07-03 MED ORDER — SODIUM CHLORIDE 0.9 % IR SOLN
Status: DC | PRN
  Administered 2022-07-03: 1000 mL via INTRAVESICAL

## 2022-07-03 MED ORDER — MIDAZOLAM HCL 2 MG/2ML IJ SOLN
INTRAMUSCULAR | Status: DC | PRN
  Administered 2022-07-03: 2 mg via INTRAVENOUS

## 2022-07-03 MED ORDER — ACETAMINOPHEN 10 MG/ML IV SOLN
INTRAVENOUS | Status: AC
  Filled 2022-07-03: qty 100

## 2022-07-03 MED ORDER — 0.9 % SODIUM CHLORIDE (POUR BTL) OPTIME
TOPICAL | Status: DC | PRN
  Administered 2022-07-03: 500 mL

## 2022-07-03 MED ORDER — SUGAMMADEX SODIUM 200 MG/2ML IV SOLN
INTRAVENOUS | Status: DC | PRN
  Administered 2022-07-03: 140 mg via INTRAVENOUS

## 2022-07-03 SURGICAL SUPPLY — 72 items
ADH SKN CLS APL DERMABOND .7 (GAUZE/BANDAGES/DRESSINGS) ×2
BAG DRN RND TRDRP ANRFLXCHMBR (UROLOGICAL SUPPLIES) ×2
BAG URINE DRAIN 2000ML AR STRL (UROLOGICAL SUPPLIES) ×2 IMPLANT
BASIN KIT SINGLE STR (MISCELLANEOUS) ×2 IMPLANT
BLADE SURG 15 STRL LF DISP TIS (BLADE) ×2 IMPLANT
BLADE SURG 15 STRL SS (BLADE) ×2
BLADE SURG SZ11 CARB STEEL (BLADE) ×2 IMPLANT
CANNULA CAP OBTURATR AIRSEAL 8 (CAP) ×2 IMPLANT
CATH FOLEY 2WAY 5CC 16FR (CATHETERS) ×2
CATH URTH 16FR FL 2W BLN LF (CATHETERS) ×2 IMPLANT
COVER TIP SHEARS 8 DVNC (MISCELLANEOUS) ×2 IMPLANT
DERMABOND ADVANCED .7 DNX12 (GAUZE/BANDAGES/DRESSINGS) ×2 IMPLANT
DRAPE 3/4 80X56 (DRAPES) IMPLANT
DRAPE ARM DVNC X/XI (DISPOSABLE) ×8 IMPLANT
DRAPE COLUMN DVNC XI (DISPOSABLE) ×2 IMPLANT
DRAPE ROBOT W/ LEGGING 30X125 (DRAPES) ×2 IMPLANT
DRAPE UNDER BUTTOCK W/FLU (DRAPES) ×2 IMPLANT
DRIVER NDL MEGA SUTCUT DVNCXI (INSTRUMENTS) ×2 IMPLANT
DRIVER NDLE MEGA SUTCUT DVNCXI (INSTRUMENTS) ×2 IMPLANT
ELECT REM PT RETURN 9FT ADLT (ELECTROSURGICAL) ×2
ELECTRODE REM PT RTRN 9FT ADLT (ELECTROSURGICAL) ×2 IMPLANT
FORCEPS BPLR FENES DVNC XI (FORCEP) ×2 IMPLANT
FORCEPS BPLR R/ABLATION 8 DVNC (INSTRUMENTS) ×2 IMPLANT
GAUZE 4X4 16PLY ~~LOC~~+RFID DBL (SPONGE) ×2 IMPLANT
GLOVE BIO SURGEON STRL SZ7 (GLOVE) ×6 IMPLANT
GLOVE BIOGEL PI IND STRL 7.5 (GLOVE) ×6 IMPLANT
GOWN STRL REUS W/ TWL LRG LVL3 (GOWN DISPOSABLE) ×6 IMPLANT
GOWN STRL REUS W/ TWL XL LVL3 (GOWN DISPOSABLE) ×2 IMPLANT
GOWN STRL REUS W/TWL LRG LVL3 (GOWN DISPOSABLE) ×6
GOWN STRL REUS W/TWL XL LVL3 (GOWN DISPOSABLE) ×2
IRRIGATION STRYKERFLOW (MISCELLANEOUS) IMPLANT
IRRIGATOR STRYKERFLOW (MISCELLANEOUS)
IRRIGATOR SUCT 8 DISP DVNC XI (IRRIGATION / IRRIGATOR) IMPLANT
IV LACTATED RINGERS 1000ML (IV SOLUTION) ×2 IMPLANT
IV NS 1000ML (IV SOLUTION) ×2
IV NS 1000ML BAXH (IV SOLUTION) ×2 IMPLANT
KIT PINK PAD W/HEAD ARE REST (MISCELLANEOUS) ×2
KIT PINK PAD W/HEAD ARM REST (MISCELLANEOUS) ×2 IMPLANT
KIT TURNOVER CYSTO (KITS) ×2 IMPLANT
LABEL OR SOLS (LABEL) ×2 IMPLANT
MANIFOLD NEPTUNE II (INSTRUMENTS) ×2 IMPLANT
MANIPULATOR VCARE LG CRV RETR (MISCELLANEOUS) IMPLANT
NDL HYPO 22X1.5 SAFETY MO (MISCELLANEOUS) ×2 IMPLANT
NEEDLE HYPO 22X1.5 SAFETY MO (MISCELLANEOUS) ×2 IMPLANT
NS IRRIG 500ML POUR BTL (IV SOLUTION) ×2 IMPLANT
OBTURATOR OPTICAL STND 8 DVNC (TROCAR) ×2
OBTURATOR OPTICALSTD 8 DVNC (TROCAR) ×2 IMPLANT
OCCLUDER COLPOPNEUMO (BALLOONS) ×2 IMPLANT
PACK LAP CHOLECYSTECTOMY (MISCELLANEOUS) ×2 IMPLANT
PAD PREP OB/GYN DISP 24X41 (PERSONAL CARE ITEMS) ×2 IMPLANT
SCISSORS MNPLR CVD DVNC XI (INSTRUMENTS) ×2 IMPLANT
SCRUB CHG 4% DYNA-HEX 4OZ (MISCELLANEOUS) ×2 IMPLANT
SEAL UNIV 5-12 XI (MISCELLANEOUS) ×6 IMPLANT
SEALER VESSEL EXT DVNC XI (MISCELLANEOUS) IMPLANT
SET CYSTO W/LG BORE CLAMP LF (SET/KITS/TRAYS/PACK) ×2 IMPLANT
SET TUBE FILTERED XL AIRSEAL (SET/KITS/TRAYS/PACK) ×2 IMPLANT
SOL ELECTROSURG ANTI STICK (MISCELLANEOUS) ×2
SOLUTION ELECTROSURG ANTI STCK (MISCELLANEOUS) ×2 IMPLANT
SPONGE T-LAP 18X18 ~~LOC~~+RFID (SPONGE) IMPLANT
SURGILUBE 2OZ TUBE FLIPTOP (MISCELLANEOUS) ×2 IMPLANT
SUT DVC VLOC 180 0 12IN GS21 (SUTURE)
SUT MNCRL 4-0 (SUTURE) ×4
SUT MNCRL 4-0 27XMFL (SUTURE) ×4
SUT VIC AB 0 CT2 27 (SUTURE) ×2 IMPLANT
SUT VLOC 180 0 6IN GS21 (SUTURE) IMPLANT
SUT VLOC 90 S/L VL9 GS22 (SUTURE) ×2 IMPLANT
SUTURE DVC VLC 180 0 12IN GS21 (SUTURE) IMPLANT
SUTURE MNCRL 4-0 27XMF (SUTURE) ×2 IMPLANT
SYR 10ML LL (SYRINGE) ×2 IMPLANT
SYR 50ML LL SCALE MARK (SYRINGE) ×2 IMPLANT
TRAP FLUID SMOKE EVACUATOR (MISCELLANEOUS) ×2 IMPLANT
WATER STERILE IRR 500ML POUR (IV SOLUTION) ×2 IMPLANT

## 2022-07-03 NOTE — Interval H&P Note (Signed)
History and Physical Interval Note:  07/03/2022 10:46 AM  Whitney Buchanan  has presented today for surgery, with the diagnosis of menorrhagia with regular cycle, chronic pelvic pain.  The various methods of treatment have been discussed with the patient and family. After consideration of risks, benefits and other options for treatment, the patient has consented to  Procedure(s): XI ROBOTIC ASSISTED LAPAROSCOPIC HYSTERECTOMY AND SALPINGECTOMY (Bilateral) CYSTOSCOPY (N/A) as a surgical intervention.  The patient's history has been reviewed, patient examined, no change in status, stable for surgery.  I have reviewed the patient's chart and labs.  Questions were answered to the patient's satisfaction.  Consents reviewed and patient wishes to proceed.  Thomasene Mohair, MD, [facility] OB/GYN 07/03/2022 10:46 AM

## 2022-07-03 NOTE — Anesthesia Preprocedure Evaluation (Signed)
Anesthesia Evaluation  Patient identified by MRN, date of birth, ID band Patient awake    Reviewed: Allergy & Precautions, NPO status , Patient's Chart, lab work & pertinent test results  History of Anesthesia Complications (+) PONV and history of anesthetic complications  Airway Mallampati: I  TM Distance: >3 FB Neck ROM: full    Dental  (+) Dental Advidsory Given, Teeth Intact   Pulmonary neg pulmonary ROS   Pulmonary exam normal        Cardiovascular (-) angina (-) Past MI negative cardio ROS Normal cardiovascular exam(-) dysrhythmias      Neuro/Psych negative neurological ROS  negative psych ROS   GI/Hepatic negative GI ROS, Neg liver ROS,,,  Endo/Other  negative endocrine ROS    Renal/GU      Musculoskeletal   Abdominal   Peds  Hematology negative hematology ROS (+)   Anesthesia Other Findings Past Medical History: No date: Anemia No date: Menorrhagia with regular cycle 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 infection No date: PONV (postoperative nausea and vomiting) No date: Uterine fibroid  Past Surgical History: 2014: BREAST ENHANCEMENT SURGERY; Bilateral 2013: ENDOMETRIAL ABLATION 2023: LASIK; Bilateral  BMI    Body Mass Index: 25.46 kg/m      Reproductive/Obstetrics negative OB ROS                             Anesthesia Physical Anesthesia Plan  ASA: 1  Anesthesia Plan: General ETT   Post-op Pain Management:    Induction: Intravenous  PONV Risk Score and Plan: 4 or greater and Ondansetron, Dexamethasone, Midazolam, Propofol infusion and TIVA  Airway Management Planned: Oral ETT  Additional Equipment:   Intra-op Plan:   Post-operative Plan: Extubation in OR  Informed Consent: I have reviewed the patients History and Physical, chart, labs and discussed the procedure including the risks, benefits and alternatives for the proposed anesthesia with the patient or authorized  representative who has indicated his/her understanding and acceptance.     Dental Advisory Given  Plan Discussed with: Anesthesiologist, CRNA and Surgeon  Anesthesia Plan Comments: (Patient consented for risks of anesthesia including but not limited to:  - adverse reactions to medications - damage to eyes, teeth, lips or other oral mucosa - nerve damage due to positioning  - sore throat or hoarseness - Damage to heart, brain, nerves, lungs, other parts of body or loss of life  Patient voiced understanding.)       Anesthesia Quick Evaluation

## 2022-07-03 NOTE — Anesthesia Postprocedure Evaluation (Signed)
Anesthesia Post Note  Patient: Whitney Buchanan  Procedure(s) Performed: XI ROBOTIC ASSISTED LAPAROSCOPIC HYSTERECTOMY AND SALPINGECTOMY (Bilateral) CYSTOSCOPY  Patient location during evaluation: PACU Anesthesia Type: General Level of consciousness: awake and alert Pain management: pain level controlled Vital Signs Assessment: post-procedure vital signs reviewed and stable Respiratory status: spontaneous breathing, nonlabored ventilation, respiratory function stable and patient connected to nasal cannula oxygen Cardiovascular status: stable and blood pressure returned to baseline Postop Assessment: no apparent nausea or vomiting Anesthetic complications: no  No notable events documented.   Last Vitals:  Vitals:   07/03/22 1415 07/03/22 1430  BP: (!) 90/52 107/68  Pulse: 65 72  Resp: 13 19  Temp:    SpO2: 100% 100%    Last Pain:  Vitals:   07/03/22 1430  TempSrc:   PainSc: Asleep                 Stephanie Coup

## 2022-07-03 NOTE — Anesthesia Procedure Notes (Signed)
Procedure Name: Intubation Date/Time: 07/03/2022 11:20 AM  Performed by: Morene Crocker, CRNAPre-anesthesia Checklist: Patient identified, Patient being monitored, Timeout performed, Emergency Drugs available and Suction available Patient Re-evaluated:Patient Re-evaluated prior to induction Oxygen Delivery Method: Circle system utilized Preoxygenation: Pre-oxygenation with 100% oxygen Induction Type: IV induction Ventilation: Mask ventilation without difficulty Laryngoscope Size: 3 and McGraph Grade View: Grade I Tube type: Oral Tube size: 7.0 mm Number of attempts: 1 Airway Equipment and Method: Stylet and Video-laryngoscopy Placement Confirmation: ETT inserted through vocal cords under direct vision, positive ETCO2 and breath sounds checked- equal and bilateral Secured at: 20 cm Tube secured with: Tape Dental Injury: Teeth and Oropharynx as per pre-operative assessment

## 2022-07-03 NOTE — Transfer of Care (Signed)
Immediate Anesthesia Transfer of Care Note  Patient: Whitney Buchanan  Procedure(s) Performed: XI ROBOTIC ASSISTED LAPAROSCOPIC HYSTERECTOMY AND SALPINGECTOMY (Bilateral) CYSTOSCOPY  Patient Location: PACU  Anesthesia Type:General  Level of Consciousness: drowsy  Airway & Oxygen Therapy: Patient Spontanous Breathing and Patient connected to face mask oxygen  Post-op Assessment: Report given to RN and Post -op Vital signs reviewed and stable  Post vital signs: Reviewed and stable  Last Vitals:  Vitals Value Taken Time  BP 90/52 07/03/22 1415  Temp 36.1 C 07/03/22 1411  Pulse 66 07/03/22 1416  Resp 12 07/03/22 1416  SpO2 100 % 07/03/22 1416  Vitals shown include unvalidated device data.  Last Pain:  Vitals:   07/03/22 1411  TempSrc:   PainSc: Asleep         Complications: No notable events documented.

## 2022-07-03 NOTE — Op Note (Signed)
Operative Note    Name: Whitney Buchanan  Date of Service: 07/03/2022   DOB: Jun 11, 1980  MRN: [id number]    Pre-Operative Diagnosis:  1) Menorrhagia with regular cycle 2) Chronic pelvic pain   Post-Operative Diagnosis:  1) Menorrhagia with regular cycle 2) Chronic pelvic pain   Procedures:  1) Robot assisted Total Laparoscopic Hysterectomy, bilateral salpingectomy  2) Cystoscopy  Primary Surgeon: Thomasene Mohair, MD   EBL: 5 mL   IVF: 1,100 mL   Urine output: 250 mL clear urine at the end of the case.  Specimens: Uterus, cervix, and bilateral fallopian tubes  Drains: none  Complications: None   Disposition: PACU   Condition: Stable   Findings:  1) Normal appearing uterus, bilateral fallopian tubes, cervix, and ovaries. 2) On cystoscopy, no evidence of bladder injury. There was efflux of clear urine from the bilateral ureteral orifices.  3) No evidence of endometriosis.  Procedure Summary:  The patient was taken to the operating room where general anesthesia was administered and found to be adequate. She was placed in the dorsal supine lithotomy position in [place] stirrups and prepped and draped in the usual, sterile fashion. After a timeout was called an indwelling catheter was placed in her bladder.  A sterile speculum was placed in her vagina.  The anterior lip of the cervix was grasped with the single-tooth tenaculum.  The cervix was serially dilated to an 11 Pratt dilator.  The large Vcare device was placed in accordance to the manufacturer's recommendations.  The tenaculum and speculum were removed.   Attention was turned to the abdomen where after injection of local anesthetic, an 8 mm infraumbilical incision was made with the scalpel. Entry into the abdomen was obtained via Optiview trocar technique (a blunt entry technique with camera visualization through the obturator upon entry). Verification of entry into the abdomen was obtained using opening pressures. The  abdomen was insufflated with CO2. The camera was introduced through the trocar with verification of atraumatic entry.  Right and left abdominal entry sites were created after injection of local anesthetic about 8 cm lateral to the umbilical port in accordance with the Intuitive manufacturer's recommendations.  An additional port was placed 8 cm lateral to the right abdominal port with verification of clearance above the iliac crest by more than 2 cm.  The port sites were 8 mm.  The intuitive trochars were introduced under intra-abdominal camera visualization without difficulty   The XI robot was docked on the patient's left.  Clearance was verified from the patient's legs.  Through the umbilical port the camera was placed.  Through the port attached to arm 3 the monopolar scissors were placed.  Through the port attached to arm 4 the fenestrated bipolar forceps were was placed.  The forced bipolar forceps were attached to port 1.   An inspection was undertaken of the pelvis with the above-noted findings. The bilateral ureters were identified and found to be well away from the operative area of interest. The right fallopian tube was grasped at the fimbriated end and was transected along the mesosalpinx in a lateral-to-medial fashion.  The round ligament was cauterized and transected on the right. The broad ligament was opened posteriorly and anteriorly.  The retroperitoneal space was divided until the ureter was identified and found to have a normal course.  The utero-ovarian ligament was transected. Tissue was divided along the right broad ligament to the level of the internal cervical os.  Bladder tissue was dissected off the lower uterine  segment and cervix without difficulty. The right uterine artery was skeletonized and identified and after ligation was transected. The same procedure was carried out on the left side. The colpotomy was performed using monopolar electrocautery in a circumferential fashion  following the KOH ring.  The uterus and fallopian tubes and cervix were removed through the vagina.   Closure of the vaginal cuff was undertaken using the V-lock stitch in a running fashion.  All vascular pedicles were inspected and found to be hemostatic.  All instruments removed from the robotic ports.  The robot was undocked from the patient.  The abdomen was then desufflated of CO2 with the aid of 5 deep breaths from anesthesia.  All trochars were then removed.  All skin incisions were closed using 4-0 Vicryl in a subcuticular fashion and reinforced using surgical skin glue.   Cystoscopy was undertaken at this point. The Foley catheter was removed and the 70 cystoscope was gently introduced through the urethra. The bladder survey was undertaken with efflux of urine from both orifices noted. There were no defects noted in the bladder wall. The cystoscope was utilized to fully empty the bladder.  A strerile speculum was introduced in the vagina and the vaginal cuff was hemostatic. No vaginal lacerations or abrasions noted.  The vagina was undertaken to ensure no instruments or sponges remained and this was verified to be clear. The speculum was removed.   The patient tolerated the procedure well.  Sponge, lap, needle, and instrument counts were correct x 2.  VTE prophylaxis: SCDs. Antibiotic prophylaxis: Ancef 2 grams IV prior to skin incision. She was awakened in the operating room and was taken to the PACU in stable condition.   Thomasene Mohair, MD, [facility] 07/03/2022 2:03 PM

## 2022-07-03 NOTE — Discharge Instructions (Signed)

## 2022-07-04 ENCOUNTER — Encounter: Payer: Self-pay | Admitting: Obstetrics and Gynecology

## 2022-07-09 ENCOUNTER — Other Ambulatory Visit: Payer: Self-pay
